# Patient Record
Sex: Male | Born: 1955 | Race: White | Hispanic: No | Marital: Married | Smoking: Never smoker
Health system: Southern US, Community
[De-identification: ages and names within clinical notes are randomized; demographics above are authoritative.]

## PROBLEM LIST (undated history)

## (undated) DIAGNOSIS — R002 Palpitations: Secondary | ICD-10-CM

## (undated) DIAGNOSIS — T148XXA Other injury of unspecified body region, initial encounter: Secondary | ICD-10-CM

## (undated) DIAGNOSIS — E785 Hyperlipidemia, unspecified: Secondary | ICD-10-CM

## (undated) DIAGNOSIS — M1712 Unilateral primary osteoarthritis, left knee: Secondary | ICD-10-CM

## (undated) DIAGNOSIS — M1990 Unspecified osteoarthritis, unspecified site: Secondary | ICD-10-CM

## (undated) DIAGNOSIS — R079 Chest pain, unspecified: Secondary | ICD-10-CM

## (undated) DIAGNOSIS — T7840XA Allergy, unspecified, initial encounter: Secondary | ICD-10-CM

## (undated) DIAGNOSIS — C44300 Unspecified malignant neoplasm of skin of unspecified part of face: Secondary | ICD-10-CM

## (undated) HISTORY — DX: Other injury of unspecified body region, initial encounter: T14.8XXA

## (undated) HISTORY — DX: Hyperlipidemia, unspecified: E78.5

## (undated) HISTORY — DX: Unspecified malignant neoplasm of skin of unspecified part of face: C44.300

## (undated) HISTORY — PX: OTHER SURGICAL HISTORY: SHX169

## (undated) HISTORY — DX: Unilateral primary osteoarthritis, left knee: M17.12

## (undated) HISTORY — PX: TONSILLECTOMY: SUR1361

## (undated) HISTORY — DX: Unspecified osteoarthritis, unspecified site: M19.90

## (undated) HISTORY — DX: Palpitations: R00.2

## (undated) HISTORY — PX: CHOLECYSTECTOMY: SHX55

## (undated) HISTORY — DX: Allergy, unspecified, initial encounter: T78.40XA

## (undated) HISTORY — DX: Chest pain, unspecified: R07.9

## (undated) HISTORY — PX: KNEE SURGERY: SHX244

---

## 2004-03-27 ENCOUNTER — Emergency Department (HOSPITAL_COMMUNITY): Admission: EM | Admit: 2004-03-27 | Discharge: 2004-03-27 | Payer: Self-pay | Admitting: Emergency Medicine

## 2007-05-09 ENCOUNTER — Ambulatory Visit: Payer: Self-pay | Admitting: Gastroenterology

## 2007-05-23 ENCOUNTER — Ambulatory Visit: Payer: Self-pay | Admitting: Gastroenterology

## 2010-05-04 ENCOUNTER — Ambulatory Visit: Payer: Self-pay | Admitting: Vascular Surgery

## 2010-08-03 ENCOUNTER — Ambulatory Visit: Payer: Self-pay | Admitting: Vascular Surgery

## 2010-09-14 ENCOUNTER — Ambulatory Visit: Payer: Self-pay | Admitting: Vascular Surgery

## 2010-09-21 ENCOUNTER — Ambulatory Visit: Payer: Self-pay | Admitting: Vascular Surgery

## 2010-09-26 ENCOUNTER — Ambulatory Visit (HOSPITAL_COMMUNITY)
Admission: RE | Admit: 2010-09-26 | Discharge: 2010-09-26 | Payer: Self-pay | Source: Home / Self Care | Attending: Vascular Surgery | Admitting: Vascular Surgery

## 2010-09-26 IMAGING — CR DG CHEST 2V
2 series · 2 of 2 positions shown · non-contrast
Comparison: None.

CLINICAL DATA: Preoperative chest radiograph for left leg varicose
vein ligation and stripping.

CHEST - 2 VIEW

[view not recorded (1 of 2)]
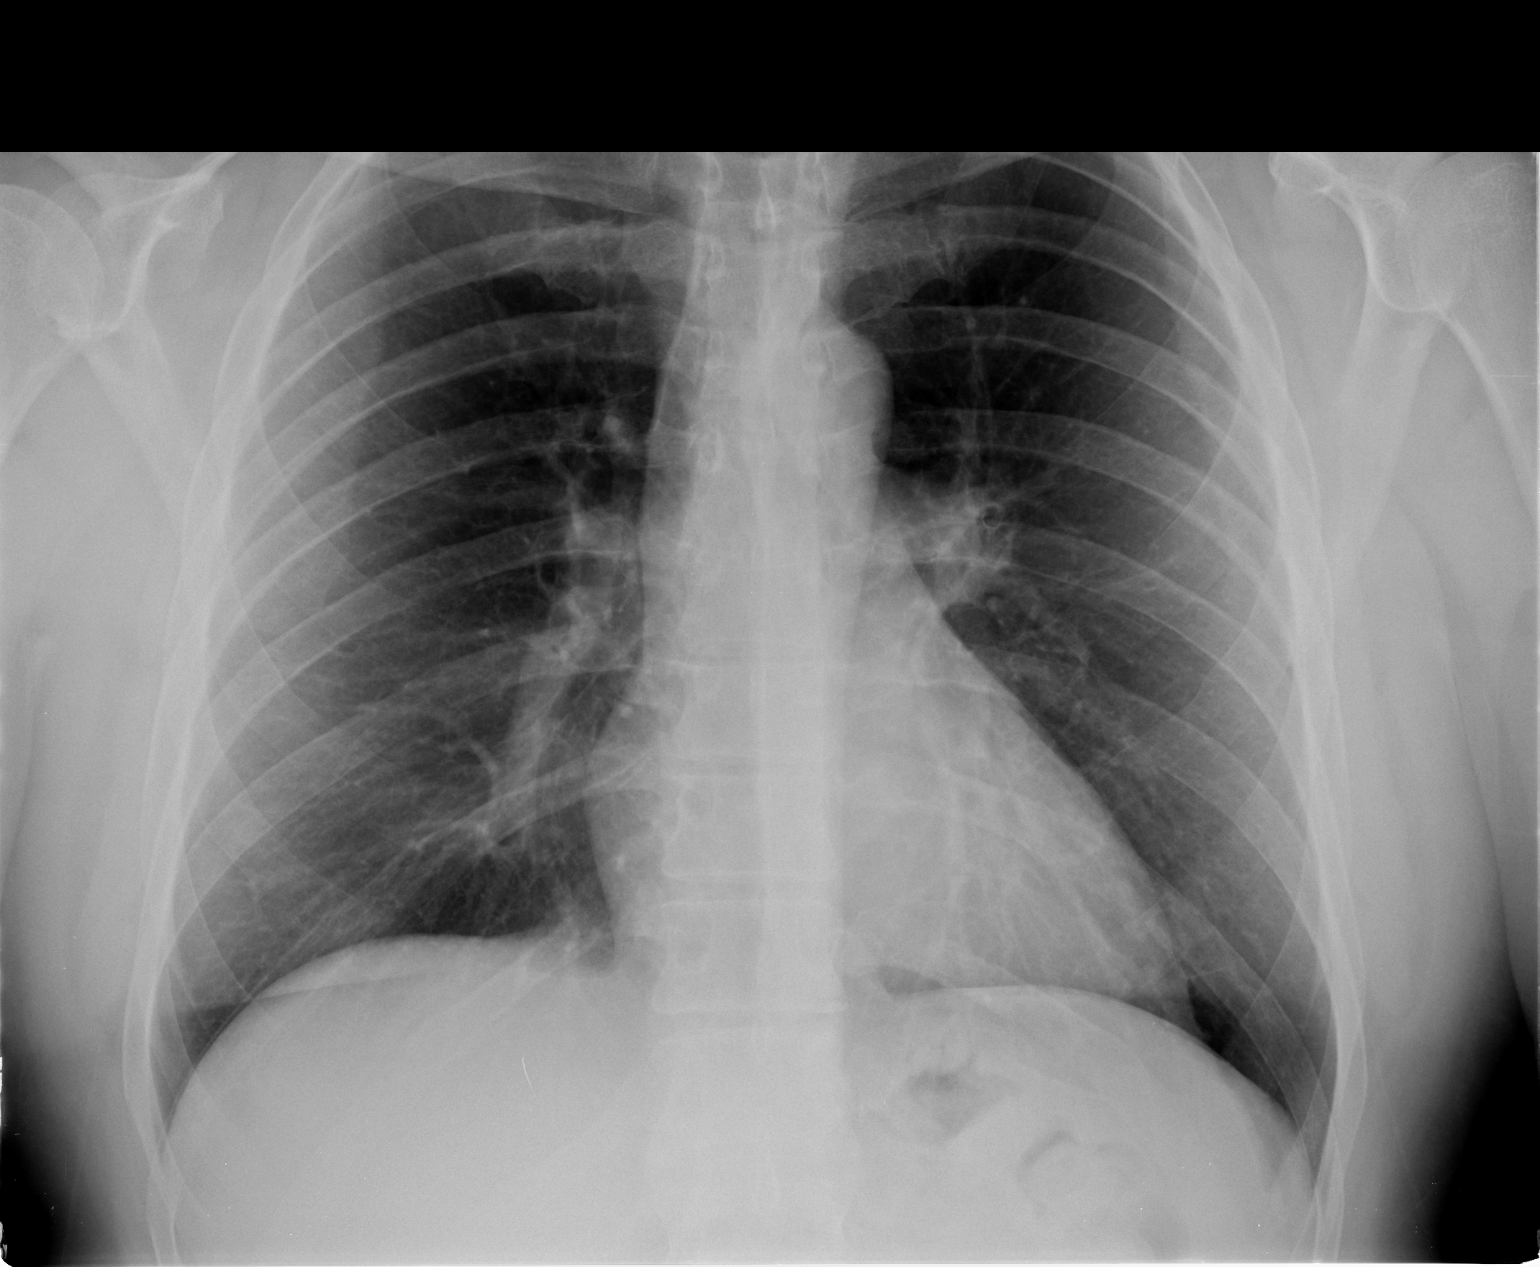

[view not recorded (2 of 2)]
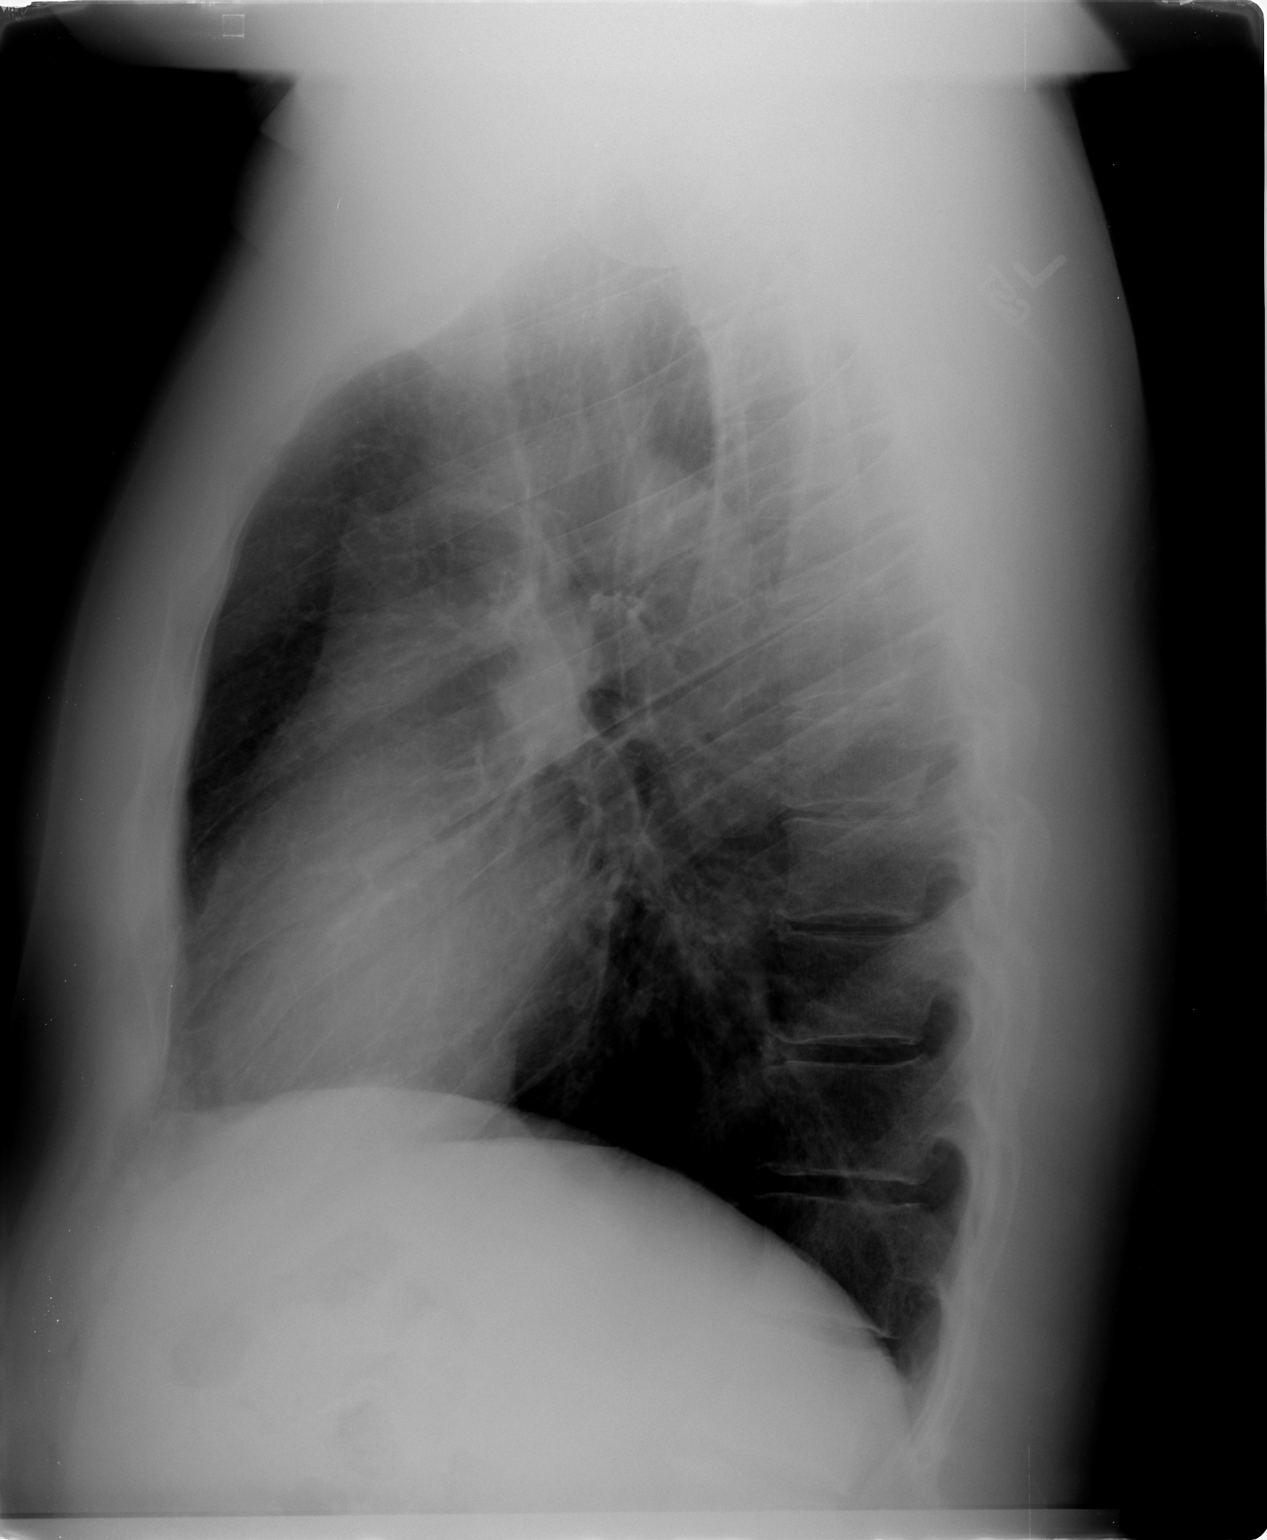

[2 of 2 positions shown; findings below may reference images not displayed]

FINDINGS: The lungs are well-aerated and clear.  There is no
evidence of focal opacification, pleural effusion or pneumothorax.

The heart is normal in size; the mediastinal contour is within
normal limits.  No acute osseous abnormalities are seen.
IMPRESSION: No acute cardiopulmonary process seen.

## 2010-10-11 ENCOUNTER — Ambulatory Visit
Admission: RE | Admit: 2010-10-11 | Discharge: 2010-10-11 | Payer: Self-pay | Source: Home / Self Care | Attending: Vascular Surgery | Admitting: Vascular Surgery

## 2010-12-19 LAB — POCT I-STAT 4, (NA,K, GLUC, HGB,HCT)
Potassium: 4.1 mEq/L (ref 3.5–5.1)
Sodium: 140 mEq/L (ref 135–145)

## 2010-12-19 LAB — SURGICAL PCR SCREEN
MRSA, PCR: NEGATIVE
Staphylococcus aureus: NEGATIVE

## 2011-02-21 NOTE — Procedures (Signed)
LOWER EXTREMITY VENOUS REFLUX EXAM   INDICATION:  Bilateral lower extremity varicose veins.   EXAM:  Using color-flow imaging and pulse Doppler spectral analysis, the  bilateral common femoral, superficial femoral, popliteal, posterior  tibial, greater and lesser saphenous veins were evaluated.  There is  evidence suggesting deep venous insufficiency in the bilateral common  femoral and left superficial femoral veins with reflux of >500  milliseconds.   The bilateral saphenofemoral junctions and GSVs are not competent with  reflux of >500 milliseconds.   The bilateral proximal short saphenous veins demonstrate competency.   GSV Diameter (used if found to be incompetent only)                                            Right    Left  Proximal Greater Saphenous Vein           0.94 cm  1.1 cm  Proximal-to-mid-thigh                     cm       cm  Mid thigh                                 0.97 cm  1.2 cm  Mid-distal thigh                          cm       cm  Distal thigh                              1.0 cm   2.8 cm  Knee                                      0.91 cm  1.0 cm   IMPRESSION:  1. Bilateral greater saphenous vein reflux is noted as described      above.  2. The right greater saphenous vein is tortuous focally at the mid      thigh level.  The left greater saphenous vein is not tortuous.  3. The deep venous system is not competent, as described above.  4. The bilateral short saphenous veins are competent.  5. There is an incompetent perforator noted at the left distal medial      ankle level measuring 0.53 cm.        ___________________________________________  Larina Earthly, M.D.   CH/MEDQ  D:  05/05/2010  T:  05/05/2010  Job:  [id number]

## 2011-02-21 NOTE — Assessment & Plan Note (Signed)
OFFICE VISIT   Philip Ross, Philip Ross  DOB:  06/04/1956                                       08/03/2010  [id number]#:[id number]   Patient presents today for continued discussion regarding his severe  bilateral venous hypertension.  He has had no improvement with  compression garment usage.  He has actually suffered a new episode of  superficial thrombophlebitis over the large varicosities on the  pretibial aspect of his right leg.  He reports that his legs are heavy  with prolonged standing, right worse than left.  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  He also reports that doing yard work or  exercise has made him diminish the frequency of this.  Also upon  returning home from work, he has to elevate his legs due to swelling and  the tiredness.   I re-imaged his veins, and this does show large saphenous veins  bilaterally with reflux into these varicosities.  He does have a  markedly enlarged left great saphenous vein at the above-knee position  with a diameter being 2.8 cm.  I explained that this precludes him from  laser ablation.   I have recommended right leg laser ablation and stab phlebectomy for  relief of his symptoms as an outpatient in our office and a staged later  date left leg ligation stripping of great saphenous vein and stab  phlebectomy in the hospital.  We will schedule this at his earliest  convenience.     Larina Earthly, M.D.  Electronically Signed   TFE/MEDQ  D:  08/03/2010  T:  08/03/2010  Job:  [id number]

## 2011-02-21 NOTE — Assessment & Plan Note (Signed)
OFFICE VISIT   Philip Ross, Philip Ross  DOB:  15-Sep-1956                                       10/11/2010  ZOXWR#:[id number]   Patient presents today for follow-up of his staged bilateral great  saphenous vein treatment.  He initially had a laser ablation of his  right great saphenous vein from just below his knee to the  saphenofemoral junction with stab phlebectomies.  He subsequently  underwent a left great saphenous vein ligation and stripping and stab  phlebectomies in the hospital as an outpatient under general anesthesia  on 09/26/10.  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  He has done quite well with the procedures.  He  has reported more discomfort in the medial thigh associated with the  left leg than the right leg.   He does have the usual amount of thickening in the mid portion of his  thigh.  All of his stab phlebectomy sites and incisions are healing  quite nicely.   He is released to full activity and will see [place] on an as-needed basis.     Larina Earthly, M.D.  Electronically Signed   TFE/MEDQ  D:  10/11/2010  T:  10/11/2010  Job:  [id number]   cc:   Francoise Schaumann. Halm, DO, FAAP

## 2011-02-21 NOTE — Assessment & Plan Note (Signed)
OFFICE VISIT   Philip Ross, Philip Ross  DOB:  Dec 09, 1955                                       09/20/2010  [id number]#:[id number]   This patient presents today one week follow-up from his laser ablation  of his right great saphenous vein and stab phlebectomies of multiple  tributary varicosities as an outpatient in my office on 09/14/2010.  He  did quite well throughout the procedure.  Duplex today shows no evidence  of DVT with successful ablation of his saphenous vein.  He does have a  severe venous pathology in the left leg and is scheduled for a ligation  and stripping of his left great saphenous vein and stab phlebectomy in  the hospital due to the aneurysmal dilatation making laser ablation not  possible in the left leg.  This is scheduled for 09/26/2010.     Larina Earthly, M.D.  Electronically Signed   TFE/MEDQ  D:  10/19/2010  T:  10/20/2010  Job:  [id number]

## 2011-02-21 NOTE — Procedures (Signed)
DUPLEX DEEP VENOUS EXAM - LOWER EXTREMITY   INDICATION:  Right GSV ablation.   HISTORY:  Edema:  Yes.  Trauma/Surgery:  See above.  Pain:  Yes.  PE:  No.  Previous DVT:  Anticoagulants:  Other:   DUPLEX EXAM:                CFV   SFV   PopV  PTV    GSV                R  L  R  L  R  L  R   L  R  L  Thrombosis    +     o     o     o      +  Spontaneous   +     +     +     +      o  Phasic        +     +     +     +      o  Augmentation  +     +     +     +      o  Compressible  +     +     +     +      o  Competent     +     +     +     +      o   Legend:  + - yes  o - no  p - partial  D - decreased   IMPRESSION:  1. Successful ablation of right greater saphenous vein with areas of      thrombosis at the phlebectomy sites.  2. Greater saphenous vein thrombus appears to mildly extend into the      common femoral vein.  Thrombus is nonocclusive and not mobile.     _____________________________  Larina Earthly, M.D.   LT/MEDQ  D:  09/21/2010  T:  09/21/2010  Job:  [id number]

## 2011-02-21 NOTE — Assessment & Plan Note (Signed)
OFFICE VISIT   Philip Ross, Philip Ross  DOB:  26-Aug-1956                                       09/14/2010  LKGMW#:[id number]   This patient presents today for laser ablation of his right great  saphenous vein from mid calf to just below the saphenofemoral junction  and also a stab phlebectomy of multiple tributary varicosities.  He had  no immediate complication.  He did have a large venous plexus area in  the mid thigh requiring a separate ablation from his below-knee position  to mid thigh and then from mid thigh up to his saphenofemoral junction.  We will instruct him on his  postoperative  instructions and he will see  [place] again in 1 week for a duplex follow-up and office visit.     Larina Earthly, M.D.  Electronically Signed   TFE/MEDQ  D:  09/14/2010  T:  09/15/2010  Job:  [id number]

## 2011-02-21 NOTE — Consult Note (Signed)
NEW PATIENT CONSULTATION   STALEY, LUNZ D  DOB:  [birth year]                                       05/04/2010  FAOZH#:[id number]   The patient presents today for evaluation of severe bilateral lower  extremity venous varicosities and venous hypertension.  He is an active  healthy 55 year old gentleman who has a long history of venous  varicosities.  He had tributary removal in [place] in 1989 on his  right leg, this was through large incisions to his right thigh and  proximal calf.  He had very early recurrent disease and has had  progressive changes since that time.  He does have a history of fracture  in his left leg with an open fracture through the skin in the medial  calf.  He has very large varicosities in the left calf as well.  He  reports that he is having increasingly severe discomfort with these with  prolonged standing and activity.  He also has had several episodes of  what sounds clinically to be a superficial thrombophlebitis on several  occasions in his right medial calf.  His past history is otherwise  completely normal with no major medical difficulties.   MEDICATIONS:  He is on no medications other than aspirin and  multivitamin.  His only surgery was varicose vein surgery in 1989.  Otherwise, no history of diabetes, cardiac disease or other problems.   SOCIAL HISTORY:  He is married with two children.  He works as a Audiological scientist.  He does not smoke and does not drink alcohol.   FAMILY HISTORY:  Positive for cardiac disease in his father and vascular  disease in his mother.   REVIEW OF SYSTEMS:  No weight loss or weight gain, he is 6 feet 1 inch  tall and weighs 242 pounds.  Otherwise remaining review of systems is  negative.   PHYSICAL EXAM:  Well-developed, well-nourished white male appearing  stated age of 55.  Blood pressure is 111/72, pulse 91, respirations 18.  He is in no acute distress.  HEENT:  Normal.  He  has 2+ radial and 2+ dorsalis pedis pulses  bilaterally.  MUSCULOSKELETAL:  Shows no major deformity or cyanosis.  NEUROLOGIC:  No focal weakness or paresthesias.  SKIN:  Without ulcers or rashes.  He has a very large plexus of  varicosities in the medial aspect of his right thigh and also large  varicosities throughout his calf on the right leg.  On the left leg he  has large varicosities in the medial distal left calf.  He underwent  noninvasive vascular laboratory studies in our office and I ordered and  independently reviewed these with the patient.  This does show gross  reflux throughout his great saphenous vein bilaterally.  He has mild  reflux in his common femoral vein bilaterally.  There is no evidence of  DVT.  I discussed options with the patient.  He has not consistently  worn compression garments.  We have fitted him today with 20-30 mmHg  thigh-high compression garments and instructed him on the use of these.  I explained that I feel that his initial procedure in 1989 did not  address the venous hypertension since the saphenous vein itself was not  addressed.  Unfortunately he had very large incisions for removal of the  tributary  varicosities which I also explained would certainly be not the  standard of care today and actually was not the standard of care in 1989  as well.  We will see him again in 3 months for further discussion.  I  do feel that he will be an excellent candidate for laser ablation of his  great saphenous vein and stab phlebectomy of multiple tributary  varicosities for symptom relief.  He would require access of his right  great saphenous vein at the below-knee position and also upper thigh due  to the saphenous vein running through this large plexus in his medial  right thigh.  We will see him again for further discussion in 3 months.     Larina Earthly, M.D.  Electronically Signed   TFE/MEDQ  D:  05/04/2010  T:  05/05/2010  Job:  [id number]

## 2012-03-20 ENCOUNTER — Encounter: Payer: Self-pay | Admitting: *Deleted

## 2012-03-20 DIAGNOSIS — T148XXA Other injury of unspecified body region, initial encounter: Secondary | ICD-10-CM | POA: Insufficient documentation

## 2012-03-20 DIAGNOSIS — R002 Palpitations: Secondary | ICD-10-CM | POA: Insufficient documentation

## 2012-08-27 ENCOUNTER — Encounter: Payer: Self-pay | Admitting: Cardiovascular Disease

## 2017-03-23 ENCOUNTER — Encounter: Payer: Self-pay | Admitting: Orthopedic Surgery

## 2017-03-23 ENCOUNTER — Ambulatory Visit (INDEPENDENT_AMBULATORY_CARE_PROVIDER_SITE_OTHER): Payer: BC Managed Care – PPO | Admitting: Orthopedic Surgery

## 2017-03-23 ENCOUNTER — Ambulatory Visit (INDEPENDENT_AMBULATORY_CARE_PROVIDER_SITE_OTHER): Payer: BC Managed Care – PPO

## 2017-03-23 VITALS — BP 125/78 | HR 64 | Ht 74.0 in | Wt 226.0 lb

## 2017-03-23 DIAGNOSIS — M25511 Pain in right shoulder: Secondary | ICD-10-CM

## 2017-03-23 DIAGNOSIS — M7521 Bicipital tendinitis, right shoulder: Secondary | ICD-10-CM

## 2017-03-23 NOTE — Patient Instructions (Signed)

## 2017-03-23 NOTE — Progress Notes (Signed)
  NEW PATIENT OFFICE VISIT    Chief Complaint  Patient presents with  . 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 He notices when he is holding something or holding something away from the body the pain increases no prior treatment.    Review of Systems  Respiratory: Negative for shortness of breath.   Cardiovascular: Negative for chest pain.     Past Medical History:  Diagnosis Date  . Arthritis   . Bone fracture   . Hyperlipidemia   . Palpitations     Past Surgical History:  Procedure Laterality Date  . KNEE SURGERY    . varicose vein removal      Family History  Problem Relation Age of Onset  . Heart attack Father    Social History  Substance Use Topics  . Smoking status: Never Smoker  . Smokeless tobacco: Never Used  . Alcohol use 0.6 - 1.2 oz/week    1 - 2 Cans of beer per week     Comment: per week    BP 125/78   Pulse 64   Ht 6\' 2"  (1.88 m)   Wt 226 lb (102.5 kg)   BMI 29.02 kg/m   Physical Exam  Constitutional: He appears well-developed and well-nourished.  Vital signs have been reviewed and are stable. Gen. appearance the patient is well-developed and well-nourished with normal grooming and hygiene. The patient is oriented 3 with normal mood and affect.  Vitals reviewed.   Right Shoulder Exam  Right shoulder exam is normal.  Tenderness  The patient is experiencing tenderness in the biceps tendon.  Range of Motion  The patient has normal right shoulder ROM.  Muscle Strength  The patient has normal right shoulder strength.  Tests  Apprehension: negative Cross Arm: negative Drop Arm: negative Hawkin's test: negative Impingement: negative Sulcus: absent  Other  Erythema: absent Sensation: normal Pulse: present  Comments:   Speed and Yergason tests were performed. Speed test was positive Yergason test was negative   Left Shoulder Exam  Left shoulder exam is normal.  Tenderness  The patient is experiencing no tenderness.     Range of Motion  The patient has normal left shoulder ROM.  Muscle Strength  The patient has normal left shoulder strength.  Tests  Apprehension: negative  Other  Erythema: absent Sensation: normal Pulse: present       Meds ordered this encounter  Medications  . Multiple Vitamins-Minerals (ICAPS PO)    Sig: Take by mouth.    Encounter Diagnoses  Name Primary?  . Right shoulder pain, unspecified chronicity Yes  . Biceps tendinitis of right upper extremity      PLAN:   X-rays show normal shoulder with type II acromion  Patient gave verbal consent and timeout was taken to confirm the right shoulder as to site of injection I injected the right biceps tendon 25-gauge needle and 40 mg of Depo-Medrol and 3 mL 1% lidocaine Ethyl chloride and alcohol use to prepare the skin No complications were noted

## 2017-04-06 ENCOUNTER — Ambulatory Visit (INDEPENDENT_AMBULATORY_CARE_PROVIDER_SITE_OTHER): Payer: BC Managed Care – PPO | Admitting: Orthopedic Surgery

## 2017-04-06 ENCOUNTER — Encounter: Payer: Self-pay | Admitting: Orthopedic Surgery

## 2017-04-06 DIAGNOSIS — M7521 Bicipital tendinitis, right shoulder: Secondary | ICD-10-CM

## 2017-04-06 NOTE — Patient Instructions (Signed)

## 2017-04-06 NOTE — Progress Notes (Signed)
Follow up   Pain right shoulder  Patient received biceps tendon injection. He got good pain relief he's just a little bit sore and is considering getting another shot  Exam shows tenderness over the right biceps tendon  He received injection  Biceps tendon injection Right biceps tendon was injected The patient gave verbal consent for cortisone injection Timeout confirmed the site of injection Medications used included 40 mg of Depo-Medrol and 3 mL 1% lidocaine After alcohol and ethyl chloride preparation the point of maximal tenderness was injected over the right biceps tendon there were no complications

## 2017-06-22 ENCOUNTER — Encounter: Payer: Self-pay | Admitting: Internal Medicine

## 2017-07-13 ENCOUNTER — Encounter: Payer: Self-pay | Admitting: Gastroenterology

## 2017-08-20 ENCOUNTER — Encounter: Payer: Self-pay | Admitting: Gastroenterology

## 2017-09-04 ENCOUNTER — Ambulatory Visit: Payer: BC Managed Care – PPO | Admitting: Urology

## 2017-09-04 DIAGNOSIS — N509 Disorder of male genital organs, unspecified: Secondary | ICD-10-CM

## 2017-09-28 ENCOUNTER — Ambulatory Visit (AMBULATORY_SURGERY_CENTER): Payer: Self-pay

## 2017-09-28 ENCOUNTER — Other Ambulatory Visit: Payer: Self-pay

## 2017-09-28 VITALS — Ht 73.0 in | Wt 241.2 lb

## 2017-09-28 DIAGNOSIS — Z1211 Encounter for screening for malignant neoplasm of colon: Secondary | ICD-10-CM

## 2017-09-28 MED ORDER — PEG-KCL-NACL-NASULF-NA ASC-C 140 G PO SOLR: 1.0000 | Freq: Once | ORAL | 0 refills | Status: AC

## 2017-09-28 NOTE — Progress Notes (Signed)
Denies allergies to eggs or soy products. Denies complication of anesthesia or sedation. Denies use of weight loss medication. Denies use of O2.   Emmi instructions declined.  

## 2017-10-11 ENCOUNTER — Encounter: Payer: Self-pay | Admitting: Gastroenterology

## 2017-10-12 ENCOUNTER — Telehealth: Payer: Self-pay | Admitting: Gastroenterology

## 2017-10-12 NOTE — Telephone Encounter (Signed)
Called pharmacy and spoke to the pharmacist.  He states that the prep was not covered by insurance.  He quoted a price of 150.00.  I called patient and left message with price of Plenvu.  Asked for him to call back at [phone].

## 2017-10-12 NOTE — Telephone Encounter (Signed)
Pharmacy states plenvu prep is not covered under pt ins and pt wanting something else called in. Pt is currently at pharmacy. pv on 12.21.18 and colon on 1.9.19

## 2017-10-12 NOTE — Telephone Encounter (Signed)
Patient called back and stated that he could pay the $150 for the Plenvu.  B.Ladislaus Repsher, CMA PV

## 2017-10-17 ENCOUNTER — Encounter: Payer: Self-pay | Admitting: Gastroenterology

## 2017-10-17 ENCOUNTER — Other Ambulatory Visit: Payer: Self-pay

## 2017-10-17 ENCOUNTER — Ambulatory Visit (AMBULATORY_SURGERY_CENTER): Payer: BC Managed Care – PPO | Admitting: Gastroenterology

## 2017-10-17 VITALS — BP 112/70 | HR 58 | Temp 97.8°F | Resp 17 | Ht 74.0 in | Wt 226.0 lb

## 2017-10-17 DIAGNOSIS — Z1211 Encounter for screening for malignant neoplasm of colon: Secondary | ICD-10-CM | POA: Diagnosis present

## 2017-10-17 DIAGNOSIS — D120 Benign neoplasm of cecum: Secondary | ICD-10-CM

## 2017-10-17 MED ORDER — SODIUM CHLORIDE 0.9 % IV SOLN: 500.0000 mL | Freq: Once | INTRAVENOUS | Status: DC

## 2017-10-17 NOTE — Patient Instructions (Signed)
**  Handouts given to patient on polyps and hemorrhoids**  YOU HAD AN ENDOSCOPIC PROCEDURE TODAY AT [facility]:   Refer to the procedure report that was given to you for any specific questions about what was found during the examination.  If the procedure report does not answer your questions, please call your gastroenterologist to clarify.  If you requested that your care partner not be given the details of your procedure findings, then the procedure report has been included in a sealed envelope for you to review at your convenience later.  YOU SHOULD EXPECT: Some feelings of bloating in the abdomen. Passage of more gas than usual.  Walking can help get rid of the air that was put into your GI tract during the procedure and reduce the bloating. If you had a lower endoscopy (such as a colonoscopy or flexible sigmoidoscopy) you may notice spotting of blood in your stool or on the toilet paper. If you underwent a bowel prep for your procedure, you may not have a normal bowel movement for a few days.  Please Note:  You might notice some irritation and congestion in your nose or some drainage.  This is from the oxygen used during your procedure.  There is no need for concern and it should clear up in a day or so.  SYMPTOMS TO REPORT IMMEDIATELY:   Following lower endoscopy (colonoscopy or flexible sigmoidoscopy):  Excessive amounts of blood in the stool  Significant tenderness or worsening of abdominal pains  Swelling of the abdomen that is new, acute  Fever of 100F or higher  Black, tarry-looking stools  For urgent or emergent issues, a gastroenterologist can be reached at any hour by calling [phone].   DIET:  We do recommend a small meal at first, but then you may proceed to your regular diet.  Drink plenty of fluids but you should avoid alcoholic beverages for 24 hours.  ACTIVITY:  You should plan to take it easy for the rest of today and you should NOT DRIVE or use  heavy machinery until tomorrow (because of the sedation medicines used during the test).    FOLLOW UP: Our staff will call the number listed on your records the next business day following your procedure to check on you and address any questions or concerns that you may have regarding the information given to you following your procedure. If we do not reach you, we will leave a message.  However, if you are feeling well and you are not experiencing any problems, there is no need to return our call.  We will assume that you have returned to your regular daily activities without incident.  If any biopsies were taken you will be contacted by phone or by letter within the next 1-3 weeks.  Please call us at [phone] if you have not heard about the biopsies in 3 weeks.    SIGNATURES/CONFIDENTIALITY: You and/or your care partner have signed paperwork which will be entered into your electronic medical record.  These signatures attest to the fact that that the information above on your After Visit Summary has been reviewed and is understood.  Full responsibility of the confidentiality of this discharge information lies with you and/or your care-partner.

## 2017-10-17 NOTE — Progress Notes (Signed)
Pt's states no medical or surgical changes since previsit or office visit. 

## 2017-10-17 NOTE — Progress Notes (Signed)
A/ox3 pleased with MAC, report to [employer]

## 2017-10-17 NOTE — Progress Notes (Signed)
Called to room to assist during endoscopic procedure.  Patient ID and intended procedure confirmed with present staff. Received instructions for my participation in the procedure from the performing physician.  

## 2017-10-17 NOTE — Op Note (Addendum)
[facility] Patient Name: Philip Ross Procedure Date: 10/17/2017 9:44 AM MRN: [id number] Endoscopist: Remo Lipps P. Armbruster MD, MD Age: 62 Referring MD:  Date of Birth: 1956/07/16 Gender: Male Account #: [id number] Procedure:                Colonoscopy Indications:              Screening for colorectal malignant neoplasm Medicines:                Monitored Anesthesia Care Procedure:                Pre-Anesthesia Assessment:                           - Prior to the procedure, a History and Physical                            was performed, and patient medications and                            allergies were reviewed. The patient's tolerance of                            previous anesthesia was also reviewed. The risks                            and benefits of the procedure and the sedation                            options and risks were discussed with the patient.                            All questions were answered, and informed consent                            was obtained. Prior Anticoagulants: The patient has                            taken no previous anticoagulant or antiplatelet                            agents. ASA Grade Assessment: II - A patient with                            mild systemic disease. After reviewing the risks                            and benefits, the patient was deemed in                            satisfactory condition to undergo the procedure.                           After obtaining informed consent, the colonoscope  was passed under direct vision. Throughout the                            procedure, the patient's blood pressure, pulse, and                            oxygen saturations were monitored continuously. The                            Model CF-HQ190L [id number]) scope was introduced                            through the anus and advanced to the the cecum,                            identified by  appendiceal orifice and ileocecal                            valve. The colonoscopy was performed without                            difficulty. The patient tolerated the procedure                            well. The quality of the bowel preparation was                            good. The ileocecal valve, appendiceal orifice, and                            rectum were photographed. Scope In: 9:50:01 AM Scope Out: 10:17:14 AM Scope Withdrawal Time: 0 hours 21 minutes 19 seconds  Total Procedure Duration: 0 hours 27 minutes 13 seconds  Findings:                 The perianal and digital rectal examinations were                            normal.                           Two sessile polyps were found in the cecum. The                            polyps were 4 to 7 mm in size. These polyps were                            removed with a cold snare. Resection and retrieval                            were complete.                           Internal hemorrhoids were found during  retroflexion. The hemorrhoids were small.                           The colon was tortous. The exam was otherwise                            without abnormality. Complications:            No immediate complications. Estimated blood loss:                            Minimal. Estimated Blood Loss:     Estimated blood loss was minimal. Impression:               - Two 4 to 7 mm polyps in the cecum, removed with a                            cold snare. Resected and retrieved.                           - Internal hemorrhoids.                           - Tortous colon.                           - The examination was otherwise normal. Recommendation:           - Patient has a contact number available for                            emergencies. The signs and symptoms of potential                            delayed complications were discussed with the                            patient. Return to  normal activities tomorrow.                            Written discharge instructions were provided to the                            patient.                           - Resume previous diet.                           - Continue present medications.                           - Await pathology results.                           - Repeat colonoscopy is recommended for  surveillance. The colonoscopy date will be                            determined after pathology results from today's                            exam become available for review.                           - No ibuprofen, naproxen, or other non-steroidal                            anti-inflammatory drugs for 2 weeks after polyp                            removal. Remo Lipps P. Armbruster MD, MD 10/17/2017 10:20:52 AM This report has been signed electronically.

## 2017-10-18 ENCOUNTER — Telehealth: Payer: Self-pay

## 2017-10-18 NOTE — Telephone Encounter (Signed)
  Follow up Call-  Call back number 10/17/2017  Post procedure Call Back phone  # [phone]  Permission to leave phone message Yes  Some recent data might be hidden     Patient questions:  Do you have a fever, pain , or abdominal swelling? No. Pain Score  0 *  Have you tolerated food without any problems? Yes.    Have you been able to return to your normal activities? Yes.    Do you have any questions about your discharge instructions: Diet   No. Medications  No. Follow up visit  No.  Do you have questions or concerns about your Care? No.  Actions: * If pain score is 4 or above: No action needed, pain <4.

## 2017-10-23 ENCOUNTER — Encounter: Payer: Self-pay | Admitting: Gastroenterology

## 2018-02-05 ENCOUNTER — Ambulatory Visit (INDEPENDENT_AMBULATORY_CARE_PROVIDER_SITE_OTHER): Payer: BC Managed Care – PPO

## 2018-02-05 ENCOUNTER — Encounter: Payer: Self-pay | Admitting: Orthopedic Surgery

## 2018-02-05 ENCOUNTER — Ambulatory Visit: Payer: BC Managed Care – PPO | Admitting: Orthopedic Surgery

## 2018-02-05 VITALS — BP 122/80 | HR 65 | Ht 74.0 in | Wt 240.0 lb

## 2018-02-05 DIAGNOSIS — M25562 Pain in left knee: Secondary | ICD-10-CM

## 2018-02-05 DIAGNOSIS — M79605 Pain in left leg: Secondary | ICD-10-CM

## 2018-02-05 DIAGNOSIS — M5410 Radiculopathy, site unspecified: Secondary | ICD-10-CM

## 2018-02-05 DIAGNOSIS — M1712 Unilateral primary osteoarthritis, left knee: Secondary | ICD-10-CM

## 2018-02-05 DIAGNOSIS — G8929 Other chronic pain: Secondary | ICD-10-CM | POA: Diagnosis not present

## 2018-02-05 MED ORDER — PREDNISONE 10 MG (48) PO TBPK: ORAL_TABLET | Freq: Every day | ORAL | 0 refills | Status: DC

## 2018-02-05 NOTE — Addendum Note (Signed)
Addended by: Carole Civil on: 02/05/2018 09:54 AM   Modules accepted: Orders

## 2018-02-05 NOTE — Progress Notes (Addendum)
NEW PATIENT OFFICE VISIT   Chief Complaint  Patient presents with  . Leg Pain    left lower leg pain goes from hip to front of lower leg / worse with standing  . Knee Pain    left     MEDICAL DECISION SECTION  xrays ordered? YES 2 BODY PARTS   My independent reading of xrays: See the dictated report  The left knee shows mild varus alignment with mild osteoarthritis of the medial compartment without secondary bone changes  The lumbar spine x-ray shows mild reactive scoliosis mild facet arthritis consistent with lumbar spondylosis mild   Encounter Diagnoses  Name Primary?  . Radicular leg pain Yes  . Primary osteoarthritis of left knee      PLAN:  I am going to recommend a steroid Dosepak  3-week follow-up  Meds ordered this encounter  Medications  . predniSONE (STERAPRED UNI-PAK 48 TAB) 10 MG (48) TBPK tablet    Sig: Take by mouth daily. 10 mg 12-day double strength Dosepak as directed    Dispense:  48 tablet    Refill:  0   Injection? NO MRI/CT/? NO   Chief Complaint  Patient presents with  . Leg Pain    left lower leg pain goes from hip to front of lower leg / worse with standing  . Knee Pain    left    62 year old male complains of pain left leg and left knee.  He had an open fracture tib-fib junior in high school treated successfully with irrigation debridement and multiple serial castings.  Over the years he has had aching in the lower leg when the weather changes or gets cold.  However, he has had 6 weeks of dull diffuse left leg pain involving his left hip knee and lower leg not associated with any weakness or numbness or tingling.  He does note pain with standing for long periods of time.  He does not localize the knee pain it is diffuse.  He notes no swelling or catching or locking or giving way in the knee denies back pain   Review of Systems  Constitutional: Negative for chills, fever and weight loss.  Respiratory: Negative for shortness of  breath.   Cardiovascular: Negative for chest pain.  Neurological: Negative for tingling.     Past Medical History:  Diagnosis Date  . Allergy   . Arthritis   . Bone fracture   . Hyperlipidemia   . Palpitations     Past Surgical History:  Procedure Laterality Date  . KNEE SURGERY    . TONSILLECTOMY    . varicose vein removal      Family History  Problem Relation Age of Onset  . Heart attack Father   . Colon cancer Neg Hx   . Esophageal cancer Neg Hx   . Pancreatic cancer Neg Hx   . Rectal cancer Neg Hx   . Stomach cancer Neg Hx    Social History   Tobacco Use  . Smoking status: Never Smoker  . Smokeless tobacco: Never Used  Substance Use Topics  . Alcohol use: Yes    Alcohol/week: 0.6 - 1.2 oz    Types: 1 - 2 Cans of beer per week    Comment: per week  . Drug use: Not on file    @ALL @  Current Meds  Medication Sig  . Multiple Vitamins-Minerals (ICAPS PO) Take by mouth.    BP 122/80   Pulse 65   Ht 6\' 2"  (1.88 m)  Wt 240 lb (108.9 kg)   BMI 30.81 kg/m   Physical Exam  Constitutional: He is oriented to person, place, and time. He appears well-developed and well-nourished.  Vital signs have been reviewed and are stable. Gen. appearance the patient is well-developed and well-nourished with normal grooming and hygiene.   Musculoskeletal:       Right knee: He exhibits no effusion.       Left knee: He exhibits no effusion.  Neurological: He is alert and oriented to person, place, and time.  Skin: Skin is warm and dry. No erythema.  Psychiatric: He has a normal mood and affect.  Vitals reviewed.   Right Knee Exam   Muscle Strength  The patient has normal right knee strength.  Tenderness  The patient is experiencing no tenderness.   Range of Motion  The patient has normal right knee ROM.  Tests  Lachman:  Anterior - negative     Drawer:  Anterior - negative    Posterior - negative  Other  Scars: present Effusion: no effusion  present   Left Knee Exam   Muscle Strength  The patient has normal left knee strength.  Tenderness  The patient is experiencing no tenderness.   Range of Motion  The patient has normal left knee ROM.  Tests  McMurray:  Medial - negative  Varus: negative  Lachman:  Anterior - negative     Drawer:  Anterior - negative     Posterior - negative  Other  Erythema: absent Scars: absent Sensation: normal Pulse: present Swelling: none Effusion: no effusion present   Back Exam   Tenderness  The patient is experiencing no tenderness.   Range of Motion  The patient has normal back ROM.  Other  Gait: normal   Comments:  Mild pelvic obliquity no tenderness negative straight leg raises

## 2018-02-05 NOTE — Patient Instructions (Signed)

## 2018-02-25 ENCOUNTER — Encounter: Payer: Self-pay | Admitting: Orthopedic Surgery

## 2018-02-25 ENCOUNTER — Ambulatory Visit: Payer: BC Managed Care – PPO | Admitting: Orthopedic Surgery

## 2018-02-25 VITALS — BP 107/75 | HR 72 | Ht 74.0 in | Wt 240.0 lb

## 2018-02-25 DIAGNOSIS — M1712 Unilateral primary osteoarthritis, left knee: Secondary | ICD-10-CM

## 2018-02-25 DIAGNOSIS — M7521 Bicipital tendinitis, right shoulder: Secondary | ICD-10-CM | POA: Diagnosis not present

## 2018-02-25 DIAGNOSIS — M5410 Radiculopathy, site unspecified: Secondary | ICD-10-CM

## 2018-02-25 NOTE — Progress Notes (Signed)
Progress Note   Patient ID: Philip Ross, male   DOB: [birth year], 62 y.o.   MRN: [id number]  Chief Complaint  Patient presents with  . Back Pain    better after the prednisone taper   . Knee Pain    left     Medical decision-making Encounter Diagnoses  Name Primary?  . Radicular leg pain Yes  . Primary osteoarthritis of left knee   . Biceps tendinitis of right upper extremity    Established problem radicular leg pain improved  Primary osteoarthritis left knee worse  PLAN:   The radicular leg pain has responded well to the prednisone Dosepak  The patient is still complaining of painful left knee especially with weightbearing and standing for long periods of time   I recommended left knee injection he confirmed  Procedure note left knee injection verbal consent was obtained to inject left knee joint  Timeout was completed to confirm the site of injection  The medications used were 40 mg of Depo-Medrol and 1% lidocaine 3 cc  Anesthesia was provided by ethyl chloride and the skin was prepped with alcohol.  After cleaning the skin with alcohol a 20-gauge needle was used to inject the left knee joint. There were no complications. A sterile bandage was applied.       No orders of the defined types were placed in this encounter.        Chief Complaint  Patient presents with  . Back Pain    better after the prednisone taper   . Knee Pain    left    62 year old male with radicular left leg pain and left knee pain we treated him with steroid Dosepak.  He comes in today complaining of persistent and worsening left knee pain although the radicular leg pain has resolved.  He complains of a dull aching intermittent pain in the left knee which is brought on by standing in one place.  No associated catching locking swelling some loss of motion    Review of Systems  Constitutional: Negative for fever.  Skin: Negative.   Neurological: Negative for tingling and  focal weakness.   Past Medical History:  Diagnosis Date  . Allergy   . Arthritis   . Bone fracture   . Hyperlipidemia   . Palpitations     Current Meds  Medication Sig  . Multiple Vitamins-Minerals (ICAPS PO) Take by mouth.    Allergies  Allergen Reactions  . Niaspan [Niacin Er]     flushing     BP 107/75   Pulse 72   Ht 6\' 2"  (1.88 m)   Wt 240 lb (108.9 kg)   BMI 30.81 kg/m   Physical Exam  Constitutional: He is oriented to person, place, and time. He appears well-developed and well-nourished.  Vital signs have been reviewed and are stable. Gen. appearance the patient is well-developed and well-nourished with normal grooming and hygiene.   Musculoskeletal:       Legs: Neurological: He is alert and oriented to person, place, and time.  Skin: Skin is warm and dry. No erythema.  Psychiatric: He has a normal mood and affect.  Vitals reviewed.      Arther Abbott, MD 02/25/2018 12:04 PM

## 2018-08-06 ENCOUNTER — Other Ambulatory Visit (HOSPITAL_COMMUNITY): Payer: Self-pay | Admitting: Internal Medicine

## 2018-08-06 DIAGNOSIS — R1011 Right upper quadrant pain: Secondary | ICD-10-CM

## 2018-08-08 ENCOUNTER — Ambulatory Visit (HOSPITAL_COMMUNITY)
Admission: RE | Admit: 2018-08-08 | Discharge: 2018-08-08 | Disposition: A | Discharge status: Home / Self Care | Payer: BC Managed Care – PPO | Source: Ambulatory Visit | Attending: Internal Medicine | Admitting: Internal Medicine

## 2018-08-08 DIAGNOSIS — R1011 Right upper quadrant pain: Secondary | ICD-10-CM

## 2018-08-08 DIAGNOSIS — K760 Fatty (change of) liver, not elsewhere classified: Secondary | ICD-10-CM | POA: Diagnosis not present

## 2018-08-08 DIAGNOSIS — K8020 Calculus of gallbladder without cholecystitis without obstruction: Secondary | ICD-10-CM | POA: Diagnosis not present

## 2018-08-13 ENCOUNTER — Ambulatory Visit (HOSPITAL_COMMUNITY): Payer: BC Managed Care – PPO

## 2018-08-19 ENCOUNTER — Other Ambulatory Visit: Payer: Self-pay | Admitting: Surgery

## 2018-11-01 ENCOUNTER — Other Ambulatory Visit: Payer: Self-pay | Admitting: Surgery

## 2019-12-07 ENCOUNTER — Ambulatory Visit: Payer: BC Managed Care – PPO | Attending: Internal Medicine

## 2019-12-07 DIAGNOSIS — Z23 Encounter for immunization: Secondary | ICD-10-CM | POA: Insufficient documentation

## 2019-12-07 NOTE — Progress Notes (Signed)
   Covid-19 Vaccination Clinic  Name:  GENEVA LEAVELLE    MRN: [id number] DOB: Oct 12, 1955  12/07/2019  Mr. Whitsell was observed post Covid-19 immunization for 15 minutes without incidence. He was provided with Vaccine Information Sheet and instruction to access the V-Safe system.   Mr. Mikeal was instructed to call 911 with any severe reactions post vaccine: Marland Kitchen Difficulty breathing  . Swelling of your face and throat  . A fast heartbeat  . A bad rash all over your body  . Dizziness and weakness    Immunizations Administered    Name Date Dose VIS Date Route   Moderna COVID-19 Vaccine 12/07/2019 10:03 AM 0.5 mL 09/09/2019 Intramuscular   Manufacturer: Moderna   Lot: [id number]   [id number]

## 2020-01-10 ENCOUNTER — Ambulatory Visit: Payer: BC Managed Care – PPO | Attending: Internal Medicine

## 2020-01-10 DIAGNOSIS — Z23 Encounter for immunization: Secondary | ICD-10-CM

## 2020-01-10 NOTE — Progress Notes (Signed)
   Covid-19 Vaccination Clinic  Name:  Philip Ross    MRN: [id number] DOB: Jun 03, 1956  01/10/2020  Philip Ross was observed post Covid-19 immunization for 15 minutes without incident. He was provided with Vaccine Information Sheet and instruction to access the V-Safe system.   Philip Ross was instructed to call 911 with any severe reactions post vaccine: Marland Kitchen Difficulty breathing  . Swelling of face and throat  . A fast heartbeat  . A bad rash all over body  . Dizziness and weakness   Immunizations Administered    Name Date Dose VIS Date Route   Moderna COVID-19 Vaccine 01/10/2020  9:56 AM 0.5 mL 09/09/2019 Intramuscular   Manufacturer: Moderna   Lot: [id number]   [place]:[id number]

## 2020-10-06 ENCOUNTER — Encounter (HOSPITAL_COMMUNITY): Payer: Self-pay | Admitting: *Deleted

## 2020-10-06 ENCOUNTER — Other Ambulatory Visit: Payer: Self-pay

## 2020-10-06 ENCOUNTER — Emergency Department (HOSPITAL_COMMUNITY): Payer: BC Managed Care – PPO

## 2020-10-06 ENCOUNTER — Emergency Department (HOSPITAL_COMMUNITY)
Admission: EM | Admit: 2020-10-06 | Discharge: 2020-10-06 | Disposition: A | Discharge status: Home / Self Care | Payer: BC Managed Care – PPO | Attending: Emergency Medicine | Admitting: Emergency Medicine

## 2020-10-06 DIAGNOSIS — R0789 Other chest pain: Secondary | ICD-10-CM | POA: Diagnosis present

## 2020-10-06 LAB — BASIC METABOLIC PANEL
Anion gap: 9 (ref 5–15)
BUN: 20 mg/dL (ref 8–23)
CO2: 25 mmol/L (ref 22–32)
Calcium: 9 mg/dL (ref 8.9–10.3)
Chloride: 104 mmol/L (ref 98–111)
Creatinine, Ser: 1.05 mg/dL (ref 0.61–1.24)
GFR, Estimated: >60 mL/min (ref >60)
Glucose, Bld: 100 mg/dL — ABNORMAL HIGH (ref 70–99)
Potassium: 4.2 mmol/L (ref 3.5–5.1)
Sodium: 138 mmol/L (ref 135–145)

## 2020-10-06 LAB — TROPONIN I (HIGH SENSITIVITY)
Troponin I (High Sensitivity): 3 ng/L (ref <18)
Troponin I (High Sensitivity): 4 ng/L (ref <18)

## 2020-10-06 LAB — CBC
HCT: 42.2 % (ref 39.0–52.0)
Hemoglobin: 14.8 g/dL (ref 13.0–17.0)
MCH: 31.9 pg (ref 26.0–34.0)
MCHC: 35.1 g/dL (ref 30.0–36.0)
MCV: 90.9 fL (ref 80.0–100.0)
Platelets: 204 10*3/uL (ref 150–400)
RBC: 4.64 MIL/uL (ref 4.22–5.81)
RDW: 12.5 % (ref 11.5–15.5)
WBC: 5.6 10*3/uL (ref 4.0–10.5)
nRBC: 0 % (ref 0.0–0.2)

## 2020-10-06 NOTE — ED Provider Notes (Signed)
[facility] EMERGENCY DEPARTMENT Provider Note  CSN: [id number] Arrival date & time: 10/06/20 1036    History Chief Complaint  Patient presents with  . Chest Pain    HPI  Philip Ross is a 64 y.o. male with history of palpitations but no known CAD reports intermittent chest pains for about 2 weeks, thought it was indigestion. Described as aching and midsternal, occasionally moves into his L shoulder. Not associated with SOB, diaphoresis or nausea. No particular provoking or relieving factors. He was seen at his PCP office today for a regularly scheduled visit and had an EKG there that was reportedly abnormal so he was sent to the ED. He is pain free today.    Past Medical History:  Diagnosis Date  . Allergy   . Arthritis   . Bone fracture   . Hyperlipidemia   . Palpitations     Past Surgical History:  Procedure Laterality Date  . KNEE SURGERY    . TONSILLECTOMY    . varicose vein removal      Family History  Problem Relation Age of Onset  . Heart attack Father   . Colon cancer Neg Hx   . Esophageal cancer Neg Hx   . Pancreatic cancer Neg Hx   . Rectal cancer Neg Hx   . Stomach cancer Neg Hx     Social History   Tobacco Use  . Smoking status: Never Smoker  . Smokeless tobacco: Never Used  Substance Use Topics  . Alcohol use: Yes    Alcohol/week: 1.0 - 2.0 standard drink    Types: 1 - 2 Cans of beer per week    Comment: per week     Home Medications Prior to Admission medications   Medication Sig Start Date End Date Taking? Authorizing Provider  aspirin EC 81 MG tablet Take 81 mg by mouth daily. Swallow whole.   Yes [provider]  Multiple Vitamins-Minerals (ICAPS PO) Take 1 tablet by mouth daily.   Yes [provider]     Allergies    Niaspan [niacin er]   Review of Systems   Review of Systems A comprehensive review of systems was completed and negative except as noted in HPI.    Physical Exam BP 133/82   Pulse (!) 58    Temp 97.9 F (36.6 C) (Oral)   Resp 14   Ht 6\' 1"  (1.854 m)   Wt 108.9 kg   SpO2 100%   BMI 31.66 kg/m   Physical Exam Vitals and nursing note reviewed.  Constitutional:      Appearance: Normal appearance.  HENT:     Head: Normocephalic and atraumatic.     Nose: Nose normal.     Mouth/Throat:     Mouth: Mucous membranes are moist.  Eyes:     Extraocular Movements: Extraocular movements intact.     Conjunctiva/sclera: Conjunctivae normal.  Cardiovascular:     Rate and Rhythm: Normal rate.  Pulmonary:     Effort: Pulmonary effort is normal.     Breath sounds: Normal breath sounds.  Abdominal:     General: Abdomen is flat.     Palpations: Abdomen is soft.     Tenderness: There is no abdominal tenderness.  Musculoskeletal:        General: No swelling. Normal range of motion.     Cervical back: Neck supple.  Skin:    General: Skin is warm and dry.  Neurological:     General: No focal deficit present.  Mental Status: He is alert.  Psychiatric:        Mood and Affect: Mood normal.      ED Results / Procedures / Treatments   Labs (all labs ordered are listed, but only abnormal results are displayed) Labs Reviewed  BASIC METABOLIC PANEL - Abnormal; Notable for the following components:      Result Value   Glucose, Bld 100 (*)    All other components within normal limits  CBC  TROPONIN I (HIGH SENSITIVITY)  TROPONIN I (HIGH SENSITIVITY)    EKG EKG Interpretation  Date/Time:  Wednesday October 06 2020 10:44:30 EST Ventricular Rate:  63 PR Interval:  184 QRS Duration: 94 QT Interval:  408 QTC Calculation: 417 R Axis:   -70 Text Interpretation: Normal sinus rhythm Left axis deviation Abnormal ECG No significant change since last tracing Confirmed by Susy Frizzle [phone]) on 10/06/2020 10:53:47 AM   Radiology DG Chest Portable 1 View  Result Date: 10/06/2020 CLINICAL DATA:  Chest pain EXAM: PORTABLE CHEST 1 VIEW COMPARISON:  Chest radiograph dated  09/26/2010 FINDINGS: The heart size and mediastinal contours are within normal limits. Both lungs are clear. The visualized skeletal structures are unremarkable. IMPRESSION: No active disease. Electronically Signed   By: Romona Curls M.D.   On: 10/06/2020 11:34    Procedures Procedures  Medications Ordered in the ED Medications - No data to display   MDM Rules/Calculators/A&P MDM PCP EKG is not available for me to review, but our EKG today is neg for ischemia, LAD is unchanged from previous. Symptoms are atypical for ACS. Initial labs and CXR are normal, will check second Trop. He is low risk without history of DM, HLD, HTN or tobacco abuse. If Trop remains neg, anticipate discharge with continued outpatient management and consideration of outpatient stress test. ED Course  I have reviewed the triage vital signs and the nursing notes.  Pertinent labs & imaging results that were available during my care of the patient were reviewed by me and considered in my medical decision making (see chart for details).  Clinical Course as of 10/07/20 0700  Wed Oct 06, 2020  1425 Second trop is still normal. Patient remains asymptomatic. Will refer back to PCP and Cardiology for further evaluation. RTED for any other concerns.  [CS]    Clinical Course User Index [CS] Pollyann Savoy, MD    Final Clinical Impression(s) / ED Diagnoses Final diagnoses:  Atypical chest pain    Rx / DC Orders ED Discharge Orders    None       Pollyann Savoy, MD 10/07/20 0700

## 2020-10-06 NOTE — ED Triage Notes (Signed)
Pt with chest pain mid that radiates to left shoulder for past couple weeks.  Pt seen at Dr. Scharlene Gloss office and EKG done and abnormal per pt.

## 2020-10-06 NOTE — ED Notes (Signed)
Pt denies chest pain at this time.

## 2020-11-04 NOTE — Progress Notes (Signed)
Cardiology Office Note:    Date:  11/05/2020   ID:  Philip Ross, DOB 04/30/1956, MRN [id number]  PCP:  Celene Squibb, MD  [facility] Cardiologist:  Leslieanne Cobarrubias  [facility] Electrophysiologist:  None   Referring MD: Celene Squibb, MD   Chief Complaint  Patient presents with  . Abnormal ECG  2  Jan. 28, 2022   Philip Ross is a 65 y.o. male with a hx of  Abnormal ECG , hyperlipidemia.  We were asked to see him for further evaluation of an abnormal ECG by Dr. Nevada Crane.  He was seen at [facility] with some chest pain .  I saw him 10 years ago  Chest pain for the past 2 weeks  ECG showed left axis deviation. We were asked to see  Him for further evaluation .  Has had heaviness in his chest for the past 3-4 weeks. Was constant , now is intermittant Like a muscle cramp.  Not exertional Occurs when he is standing   Was sent to the ER ,  Work up was negative ,  troponins are normal . Had some chest heaviness yesterday  Might last a few minutes.  Labs from his medical doctor were reviewed.  Total cholesterol is 174 HDL is 42 LDL is 105 Triglyceride level of 86.       Past Medical History:  Diagnosis Date  . Allergy   . Arthritis   . Bone fracture   . Chest pain   . Hyperlipidemia   . Palpitations     Past Surgical History:  Procedure Laterality Date  . KNEE SURGERY    . TONSILLECTOMY    . varicose vein removal      Current Medications: Current Meds  Medication Sig  . Ascorbic Acid (VITAMIN C) 1000 MG tablet Take 1,000 mg by mouth daily.  Marland Kitchen aspirin EC 81 MG tablet Take 81 mg by mouth daily. Swallow whole.  . metoprolol tartrate (LOPRESSOR) 100 MG tablet Take 1 pill (100 mg) 2 hours before your Coronary CT  . Multiple Vitamins-Minerals (ICAPS PO) Take 1 tablet by mouth daily.  . Turmeric (QC TUMERIC COMPLEX) 500 MG CAPS Take 1 capsule by mouth daily at 12 noon.  . Zinc 50 MG TABS Take 1 tablet by mouth daily in the afternoon.     Allergies:   Niaspan  [niacin er]   Social History   Socioeconomic History  . Marital status: Married    Spouse name: Not on file  . Number of children: Not on file  . Years of education: Not on file  . Highest education level: Not on file  Occupational History  . Not on file  Tobacco Use  . Smoking status: Never Smoker  . Smokeless tobacco: Never Used  Substance and Sexual Activity  . Alcohol use: Yes    Alcohol/week: 1.0 - 2.0 standard drink    Types: 1 - 2 Cans of beer per week    Comment: per week  . Drug use: Not on file  . Sexual activity: Not on file  Other Topics Concern  . Not on file  Social History Narrative  . Not on file   Social Determinants of Health   Financial Resource Strain: Not on file  Food Insecurity: Not on file  Transportation Needs: Not on file  Physical Activity: Not on file  Stress: Not on file  Social Connections: Not on file     Family History: The patient's family history  includes Heart attack in his father. There is no history of Colon cancer, Esophageal cancer, Pancreatic cancer, Rectal cancer, or Stomach cancer.  ROS:   Please see the history of present illness.     All other systems reviewed and are negative.  EKGs/Labs/Other Studies Reviewed:    The following studies were reviewed today:   EKG:   Dec. 29, 2021:  NSR , LAD, no St or T wave changes.   Recent Labs: 10/06/2020: BUN 20; Creatinine, Ser 1.05; Hemoglobin 14.8; Platelets 204; Potassium 4.2; Sodium 138  Recent Lipid Panel No results found for: CHOL, TRIG, HDL, CHOLHDL, VLDL, LDLCALC, LDLDIRECT   Risk Assessment/Calculations:       Physical Exam:    VS:  BP 102/62   Pulse 63   Ht 6' 1"  (1.854 m)   Wt 234 lb 9.6 oz (106.4 kg)   SpO2 98%   BMI 30.95 kg/m     Wt Readings from Last 3 Encounters:  11/05/20 234 lb 9.6 oz (106.4 kg)  10/06/20 240 lb (108.9 kg)  02/25/18 240 lb (108.9 kg)     GEN:  Well nourished, well developed in no acute distress HEENT: Normal NECK: No JVD;  No carotid bruits LYMPHATICS: No lymphadenopathy CARDIAC: RRR, no murmurs, rubs, gallops RESPIRATORY:  Clear to auscultation without rales, wheezing or rhonchi  ABDOMEN: Soft, non-tender, non-distended MUSCULOSKELETAL:  No edema; No deformity  SKIN: Warm and dry NEUROLOGIC:  Alert and oriented x 3 PSYCHIATRIC:  Normal affect   ASSESSMENT:    1. Chest pain, unspecified type   2. Other chest pain    PLAN:       1. Chest heaviness: [name] presents with some episodes of chest heaviness.  Symptoms are somewhat atypical but are worrisome.  I would like to get a coronary CT angiogram for further evaluation.  This will also give [place] a coronary calcium score will give [place] a further risk assessment as to whether or not he has coronary artery.  If he does have significant coronary calcium, we will start him on a statin.   see him again in 3 months for follow-up visit.        Medication Adjustments/Labs and Tests Ordered: Current medicines are reviewed at length with the patient today.  Concerns regarding medicines are outlined above.  Orders Placed This Encounter  Procedures  . CT CORONARY MORPH W/CTA COR W/SCORE W/CA W/CM &/OR WO/CM  . CT CORONARY FRACTIONAL FLOW RESERVE DATA PREP  . CT CORONARY FRACTIONAL FLOW RESERVE FLUID ANALYSIS  . Basic Metabolic Panel (BMET)   Meds ordered this encounter  Medications  . metoprolol tartrate (LOPRESSOR) 100 MG tablet    Sig: Take 1 pill (100 mg) 2 hours before your Coronary CT    Dispense:  1 tablet    Refill:  0    Patient Instructions  Medication Instructions:  Your physician recommends that you continue on your current medications as directed. Please refer to the Current Medication list given to you today.  *If you need a refill on your cardiac medications before your next appointment, please call your pharmacy*   Lab Work: Your physician recommends that you return for lab work in: 1 week to 2 days before your Coronary CT  If you  have labs (blood work) drawn today and your tests are completely normal, you will receive your results only by: Marland Kitchen MyChart Message (if you have MyChart) OR . A paper copy in the mail If you have any lab test that  is abnormal or we need to change your treatment, we will call you to review the results.   Testing/Procedures: Cardiac CT scanning, (CAT scanning), is a noninvasive, special x-ray that produces cross-sectional images of the body using x-rays and a computer. CT scans help physicians diagnose and treat medical conditions. For some CT exams, a contrast material is used to enhance visibility in the area of the body being studied. CT scans provide greater clarity and reveal more details than regular x-ray exams. **See instructions below  Follow-Up: At [facility], you and your health needs are our priority.  As part of our continuing mission to provide you with exceptional heart care, we have created designated Provider Care Teams.  These Care Teams include your primary Cardiologist (physician) and Advanced Practice Providers (APPs -  Physician Assistants and Nurse Practitioners) who all work together to provide you with the care you need, when you need it.  We recommend signing up for the patient portal called "MyChart".  Sign up information is provided on this After Visit Summary.  MyChart is used to connect with patients for Virtual Visits (Telemedicine).  Patients are able to view lab/test results, encounter notes, upcoming appointments, etc.  Non-urgent messages can be sent to your provider as well.   To learn more about what you can do with MyChart, go to [email or web address].    Your next appointment:   3 month(s) on April 29 at 8 am  The format for your next appointment:   In Person  Provider:   Mertie Moores, MD   Other Instructions Your cardiac CT will be scheduled at one of the below locations:   [facility] [address] [place], [place]  [address] [phone]  [facility] [address] [place],  [address] [phone]  If scheduled at [facility], please arrive at the [facility] main entrance of [facility] 30 minutes prior to test start time. Proceed to the [facility] Radiology Department (first floor) to check-in and test prep.  If scheduled at [facility], please arrive 15 mins early for check-in and test prep.  Please follow these instructions carefully (unless otherwise directed):  Hold all erectile dysfunction medications at least 3 days (72 hrs) prior to test.  On the Night Before the Test: . Be sure to Drink plenty of water. . Do not consume any caffeinated/decaffeinated beverages or chocolate 12 hours prior to your test. . Do not take any antihistamines 12 hours prior to your test. . If the patient has contrast allergy: ? Patient will need a prescription for Prednisone and very clear instructions (as follows): 1. Prednisone 50 mg - take 13 hours prior to test 2. Take another Prednisone 50 mg 7 hours prior to test 3. Take another Prednisone 50 mg 1 hour prior to test 4. Take Benadryl 50 mg 1 hour prior to test . Patient must complete all four doses of above prophylactic medications. . Patient will need a ride after test due to Benadryl.  On the Day of the Test: . Drink plenty of water. Do not drink any water within one hour of the test. . Do not eat any food 4 hours prior to the test. . You may take your regular medications prior to the test.  . Take metoprolol (Lopressor) two hours prior to test.        After the Test: . Drink plenty of water. . After receiving IV contrast,  you may experience a mild flushed feeling. This is normal. . On occasion, you may experience a mild rash up to 24 hours after the test. This is not dangerous. If this occurs, you can take Benadryl 25 mg and increase your fluid  intake. . If you experience trouble breathing, this can be serious. If it is severe call 911 IMMEDIATELY. If it is mild, please call our office. . If you take any of these medications: Glipizide/Metformin, Avandament, Glucavance, please do not take 48 hours after completing test unless otherwise instructed.   Once we have confirmed authorization from your insurance company, we will call you to set up a date and time for your test. Based on how quickly your insurance processes prior authorizations requests, please allow up to 4 weeks to be contacted for scheduling your Cardiac CT appointment. Be advised that routine Cardiac CT appointments could be scheduled as many as 8 weeks after your provider has ordered it.  For non-scheduling related questions, please contact the cardiac imaging nurse navigator should you have any questions/concerns: Marchia Bond, Cardiac Imaging Nurse Navigator Burley Saver, Interim Cardiac Imaging Nurse [place] and Vascular Services Direct Office Dial: [phone]   For scheduling needs, including cancellations and rescheduling, please call [place], [phone].       Signed, Mertie Moores, MD  11/05/2020 9:14 AM    [place]

## 2020-11-05 ENCOUNTER — Other Ambulatory Visit: Payer: Self-pay

## 2020-11-05 ENCOUNTER — Ambulatory Visit: Payer: BC Managed Care – PPO | Admitting: Cardiovascular Disease

## 2020-11-05 ENCOUNTER — Encounter: Payer: Self-pay | Admitting: Cardiovascular Disease

## 2020-11-05 DIAGNOSIS — R079 Chest pain, unspecified: Secondary | ICD-10-CM | POA: Diagnosis not present

## 2020-11-05 DIAGNOSIS — R0789 Other chest pain: Secondary | ICD-10-CM

## 2020-11-05 MED ORDER — METOPROLOL TARTRATE 100 MG PO TABS: ORAL_TABLET | ORAL | 0 refills | Status: DC

## 2020-11-05 NOTE — Patient Instructions (Addendum)
Medication Instructions:  Your physician recommends that you continue on your current medications as directed. Please refer to the Current Medication list given to you today.  *If you need a refill on your cardiac medications before your next appointment, please call your pharmacy*   Lab Work: Your physician recommends that you return for lab work in: 1 week to 2 days before your Coronary CT  If you have labs (blood work) drawn today and your tests are completely normal, you will receive your results only by: Marland Kitchen MyChart Message (if you have MyChart) OR . A paper copy in the mail If you have any lab test that is abnormal or we need to change your treatment, we will call you to review the results.   Testing/Procedures: Cardiac CT scanning, (CAT scanning), is a noninvasive, special x-ray that produces cross-sectional images of the body using x-rays and a computer. CT scans help physicians diagnose and treat medical conditions. For some CT exams, a contrast material is used to enhance visibility in the area of the body being studied. CT scans provide greater clarity and reveal more details than regular x-ray exams. **See instructions below  Follow-Up: At [facility], you and your health needs are our priority.  As part of our continuing mission to provide you with exceptional heart care, we have created designated Provider Care Teams.  These Care Teams include your primary Cardiologist (physician) and Advanced Practice Providers (APPs -  Physician Assistants and Nurse Practitioners) who all work together to provide you with the care you need, when you need it.  We recommend signing up for the patient portal called "MyChart".  Sign up information is provided on this After Visit Summary.  MyChart is used to connect with patients for Virtual Visits (Telemedicine).  Patients are able to view lab/test results, encounter notes, upcoming appointments, etc.  Non-urgent messages can be sent to your  provider as well.   To learn more about what you can do with MyChart, go to [email or web address].    Your next appointment:   3 month(s) on April 29 at 8 am  The format for your next appointment:   In Person  Provider:   Mertie Moores, MD   Other Instructions Your cardiac CT will be scheduled at one of the below locations:   [facility] [address] [place], [place] [address] [phone]  [facility] [address] [place], [place] [address] [phone]  If scheduled at [facility], please arrive at the [facility] main entrance of [facility] 30 minutes prior to test start time. Proceed to the [facility] Radiology Department (first floor) to check-in and test prep.  If scheduled at [facility], please arrive 15 mins early for check-in and test prep.  Please follow these instructions carefully (unless otherwise directed):  Hold all erectile dysfunction medications at least 3 days (72 hrs) prior to test.  On the Night Before the Test: . Be sure to Drink plenty of water. . Do not consume any caffeinated/decaffeinated beverages or chocolate 12 hours prior to your test. . Do not take any antihistamines 12 hours prior to your test. . If the patient has contrast allergy: ? Patient will need a prescription for Prednisone and very clear instructions (as follows): 1. Prednisone 50 mg - take 13 hours prior to test 2. Take another Prednisone 50 mg 7 hours prior to test 3. Take another Prednisone 50 mg 1 hour  prior to test 4. Take Benadryl 50 mg 1 hour prior to test . Patient must complete all four doses of above prophylactic medications. . Patient will need a ride after test due to Benadryl.  On the Day of the Test: . Drink plenty of water. Do not drink any water within one hour of the test. . Do not eat any food 4 hours prior to the test. . You may take your  regular medications prior to the test.  . Take metoprolol (Lopressor) two hours prior to test.        After the Test: . Drink plenty of water. . After receiving IV contrast, you may experience a mild flushed feeling. This is normal. . On occasion, you may experience a mild rash up to 24 hours after the test. This is not dangerous. If this occurs, you can take Benadryl 25 mg and increase your fluid intake. . If you experience trouble breathing, this can be serious. If it is severe call 911 IMMEDIATELY. If it is mild, please call our office. . If you take any of these medications: Glipizide/Metformin, Avandament, Glucavance, please do not take 48 hours after completing test unless otherwise instructed.   Once we have confirmed authorization from your insurance company, we will call you to set up a date and time for your test. Based on how quickly your insurance processes prior authorizations requests, please allow up to 4 weeks to be contacted for scheduling your Cardiac CT appointment. Be advised that routine Cardiac CT appointments could be scheduled as many as 8 weeks after your provider has ordered it.  For non-scheduling related questions, please contact the cardiac imaging nurse navigator should you have any questions/concerns: Marchia Bond, Cardiac Imaging Nurse Navigator Burley Saver, Interim Cardiac Imaging Nurse [place] and Vascular Services Direct Office Dial: [phone]   For scheduling needs, including cancellations and rescheduling, please call [place], [phone].

## 2020-11-16 ENCOUNTER — Other Ambulatory Visit: Payer: BC Managed Care – PPO

## 2020-11-16 ENCOUNTER — Other Ambulatory Visit: Payer: Self-pay

## 2020-11-16 DIAGNOSIS — R079 Chest pain, unspecified: Secondary | ICD-10-CM

## 2020-11-16 LAB — BASIC METABOLIC PANEL
BUN/Creatinine Ratio: 18 (ref 10–24)
BUN: 17 mg/dL (ref 8–27)
CO2: 28 mmol/L (ref 20–29)
Calcium: 9.3 mg/dL (ref 8.6–10.2)
Chloride: 101 mmol/L (ref 96–106)
Creatinine, Ser: 0.95 mg/dL (ref 0.76–1.27)
GFR calc Af Amer: 98 mL/min/{1.73_m2} (ref >59)
GFR calc non Af Amer: 85 mL/min/{1.73_m2} (ref >59)
Glucose: 110 mg/dL — ABNORMAL HIGH (ref 65–99)
Potassium: 4 mmol/L (ref 3.5–5.2)
Sodium: 137 mmol/L (ref 134–144)

## 2020-11-17 ENCOUNTER — Telehealth (HOSPITAL_COMMUNITY): Payer: Self-pay | Admitting: *Deleted

## 2020-11-17 NOTE — Telephone Encounter (Signed)
Reaching out to patient to offer assistance regarding upcoming cardiac imaging study; pt verbalizes understanding of appt date/time, parking situation and where to check in, pre-test NPO status and medications ordered, and verified current allergies; name and call back number provided for further questions should they arise  Edrees Valent RN Navigator Cardiac Imaging [place] [facility] [phone] office [phone] cell  

## 2020-11-19 ENCOUNTER — Ambulatory Visit (HOSPITAL_COMMUNITY)
Admission: RE | Admit: 2020-11-19 | Discharge: 2020-11-19 | Disposition: A | Discharge status: Home / Self Care | Payer: BC Managed Care – PPO | Source: Ambulatory Visit | Attending: Cardiovascular Disease | Admitting: Cardiovascular Disease

## 2020-11-19 ENCOUNTER — Other Ambulatory Visit: Payer: Self-pay

## 2020-11-19 DIAGNOSIS — R0789 Other chest pain: Secondary | ICD-10-CM | POA: Insufficient documentation

## 2020-11-19 MED ORDER — NITROGLYCERIN 0.4 MG SL SUBL
SUBLINGUAL_TABLET | SUBLINGUAL | Status: AC
  Filled 2020-11-19: qty 2

## 2020-11-19 MED ORDER — IOHEXOL 350 MG/ML SOLN
80.0000 mL | Freq: Once | INTRAVENOUS | Status: AC | PRN
  Administered 2020-11-19: 80 mL via INTRAVENOUS

## 2020-11-19 MED ORDER — NITROGLYCERIN 0.4 MG SL SUBL
0.8000 mg | SUBLINGUAL_TABLET | Freq: Once | SUBLINGUAL | Status: AC
  Administered 2020-11-19: 0.8 mg via SUBLINGUAL

## 2021-02-04 ENCOUNTER — Ambulatory Visit: Payer: BC Managed Care – PPO | Admitting: Cardiovascular Disease

## 2022-04-13 DIAGNOSIS — C44329 Squamous cell carcinoma of skin of other parts of face: Secondary | ICD-10-CM | POA: Diagnosis not present

## 2022-04-20 DIAGNOSIS — Z0000 Encounter for general adult medical examination without abnormal findings: Secondary | ICD-10-CM | POA: Diagnosis not present

## 2022-06-20 ENCOUNTER — Encounter: Payer: Self-pay | Admitting: Gastroenterology

## 2022-07-10 DIAGNOSIS — Z23 Encounter for immunization: Secondary | ICD-10-CM | POA: Diagnosis not present

## 2022-07-10 DIAGNOSIS — Z0000 Encounter for general adult medical examination without abnormal findings: Secondary | ICD-10-CM | POA: Diagnosis not present

## 2022-07-10 DIAGNOSIS — Z6829 Body mass index (BMI) 29.0-29.9, adult: Secondary | ICD-10-CM | POA: Diagnosis not present

## 2022-07-10 DIAGNOSIS — H9313 Tinnitus, bilateral: Secondary | ICD-10-CM | POA: Diagnosis not present

## 2022-07-10 DIAGNOSIS — K449 Diaphragmatic hernia without obstruction or gangrene: Secondary | ICD-10-CM | POA: Diagnosis not present

## 2022-07-25 DIAGNOSIS — C44719 Basal cell carcinoma of skin of left lower limb, including hip: Secondary | ICD-10-CM | POA: Diagnosis not present

## 2022-07-25 DIAGNOSIS — D225 Melanocytic nevi of trunk: Secondary | ICD-10-CM | POA: Diagnosis not present

## 2022-07-25 DIAGNOSIS — L578 Other skin changes due to chronic exposure to nonionizing radiation: Secondary | ICD-10-CM | POA: Diagnosis not present

## 2022-07-25 DIAGNOSIS — L851 Acquired keratosis [keratoderma] palmaris et plantaris: Secondary | ICD-10-CM | POA: Diagnosis not present

## 2022-07-25 DIAGNOSIS — D485 Neoplasm of uncertain behavior of skin: Secondary | ICD-10-CM | POA: Diagnosis not present

## 2022-07-25 DIAGNOSIS — Z85828 Personal history of other malignant neoplasm of skin: Secondary | ICD-10-CM | POA: Diagnosis not present

## 2022-07-25 DIAGNOSIS — D2271 Melanocytic nevi of right lower limb, including hip: Secondary | ICD-10-CM | POA: Diagnosis not present

## 2022-07-25 DIAGNOSIS — L821 Other seborrheic keratosis: Secondary | ICD-10-CM | POA: Diagnosis not present

## 2022-07-25 DIAGNOSIS — C4441 Basal cell carcinoma of skin of scalp and neck: Secondary | ICD-10-CM | POA: Diagnosis not present

## 2022-07-25 DIAGNOSIS — L570 Actinic keratosis: Secondary | ICD-10-CM | POA: Diagnosis not present

## 2022-07-25 DIAGNOSIS — L814 Other melanin hyperpigmentation: Secondary | ICD-10-CM | POA: Diagnosis not present

## 2022-08-08 DIAGNOSIS — C44219 Basal cell carcinoma of skin of left ear and external auricular canal: Secondary | ICD-10-CM | POA: Diagnosis not present

## 2022-08-21 ENCOUNTER — Ambulatory Visit (INDEPENDENT_AMBULATORY_CARE_PROVIDER_SITE_OTHER): Payer: PPO | Admitting: Gastroenterology

## 2022-08-21 ENCOUNTER — Encounter: Payer: Self-pay | Admitting: Gastroenterology

## 2022-08-21 VITALS — BP 128/70 | HR 77 | Ht 73.0 in | Wt 228.0 lb

## 2022-08-21 DIAGNOSIS — R109 Unspecified abdominal pain: Secondary | ICD-10-CM

## 2022-08-21 DIAGNOSIS — Z86010 Personal history of colonic polyps: Secondary | ICD-10-CM | POA: Diagnosis not present

## 2022-08-21 DIAGNOSIS — K449 Diaphragmatic hernia without obstruction or gangrene: Secondary | ICD-10-CM | POA: Diagnosis not present

## 2022-08-21 DIAGNOSIS — K760 Fatty (change of) liver, not elsewhere classified: Secondary | ICD-10-CM

## 2022-08-21 NOTE — Progress Notes (Signed)
HPI :  66 year old male with a history of colon polyps, hiatal hernia, atypical chest pain, here to reestablish care for recent episodes of flank pain.  I last saw him in January 2019.  The main reason he booked a office visit today was for abdominal pain.  He actually reports his pain was in the left low back and left flank, not abdomen.  Started couple months ago.  He had 3-4 episodes of severe left flank to left lower back pain within about a weeks time or so.  Pain was not radiating to other parts of his body.  Denied sciatica.  He denied any abdominal pain.  It would last for hours at a time.  He got nauseated with this and had a few episodes of vomiting.  No fevers.  No diarrhea.  Denies any blood in his stools.  No changes in his bowels.  Denies any blood in his urine.  No dysuria.  He states this went on for about a week and then it stopped.  For the past 6 weeks he has not had any pains like this, no recurrence.  He denies any history of kidney stones.  No trauma to the area.  He has never had anything like this before.  Generally eating okay.  No weight loss.  He did have some atypical chest pain back in February 2022 which led to a cardiac CT.  He was not noted to have any coronary artery disease but he did have hiatal hernia noted, small to moderate size, was difficult for them to see any clear esophageal thickening, small reactive lymph nodes suspected in the area as well.  He denies any reflux that bothers him at baseline.  In retrospect the chest discomfort he thinks was probably related to his esophagus at the time.  Denies any dysphagia.  Has not had any recurrent episodes since that time.    He is status post cholecystectomy a few years ago.  He was noted to have gallstones on ultrasound in 2019, he was also noted to have fatty liver on that exam.  He is unaware of that.  His weight has been stable in general, although he did gained 10 pounds over the past several months.  He denies any  routine alcohol use.  He states he had some blood work done recently with his primary care which was normal but I do not see any of that on file today.  Prior work-up: Colonoscopy 10/17/2017 - The perianal and digital rectal examinations were normal. - Two sessile polyps were found in the cecum. The polyps were 4 to 7 mm in size. These polyps were removed with a cold snare. Resection and retrieval were complete. - Internal hemorrhoids were found during retroflexion. The hemorrhoids were small. - The colon was tortous. The exam was otherwise without abnormality.  Surgical [P], cecum, polyp - TUBULAR ADENOMA (THREE FRAGMENTS). - NO HIGH GRADE DYSPLASIA OR MALIGNANCY.  Due for repeat colonoscopy 10/2024  Past Medical History:  Diagnosis Date   Allergy    Arthritis    Bone fracture    Chest pain    Hyperlipidemia    Osteoarthritis of left knee    Palpitations    Skin cancer of face      Past Surgical History:  Procedure Laterality Date   CHOLECYSTECTOMY     KNEE SURGERY     TONSILLECTOMY     varicose vein removal     Family History  Problem Relation Age of  Onset   Heart disease Father    Heart attack Father    Colon cancer Neg Hx    Esophageal cancer Neg Hx    Pancreatic cancer Neg Hx    Rectal cancer Neg Hx    Stomach cancer Neg Hx    Social History   Tobacco Use   Smoking status: Never   Smokeless tobacco: Never  Vaping Use   Vaping Use: Never used  Substance Use Topics   Alcohol use: Yes    Alcohol/week: 1.0 - 2.0 standard drink of alcohol    Types: 1 - 2 Cans of beer per week    Comment: per week   Current Outpatient Medications  Medication Sig Dispense Refill   Ascorbic Acid (VITAMIN C) 1000 MG tablet Take 1,000 mg by mouth daily.     Multiple Vitamins-Minerals (ICAPS PO) Take 1 tablet by mouth daily.     No current facility-administered medications for this visit.   Allergies  Allergen Reactions   Niaspan [Niacin Er]     flushing     Review of  Systems: All systems reviewed and negative except where noted in HPI.   No labs listed in Epic recently  Physical Exam: BP 128/70   Pulse 77   Ht '6\' 1"'$  (1.854 m)   Wt 228 lb (103.4 kg)   SpO2 95%   BMI 30.08 kg/m  Constitutional: Pleasant,well-developed, male in no acute distress. HEENT: Normocephalic and atraumatic. Conjunctivae are normal. No scleral icterus. Neck supple.  Cardiovascular: Normal rate, regular rhythm.  Pulmonary/chest: Effort normal and breath sounds normal. No wheezing, rales or rhonchi. Abdominal: Soft, nondistended, nontender. There are no masses palpable. No hepatomegaly. No pain with palpation of left flank or back. Extremities: no edema Lymphadenopathy: No cervical adenopathy noted. Neurological: Alert and oriented to person place and time. Skin: Skin is warm and dry. No rashes noted. Psychiatric: Normal mood and affect. Behavior is normal.   ASSESSMENT: 66 y.o. male here for assessment of the following  1. Left flank pain   2. Fatty liver   3. Hiatal hernia   4. History of colon polyps    History and exam as above.  1 week worth of episodic left flank to left lower back pain without radiation but associated with nausea and vomiting.  He denies any dysuria.  Discussed differential diagnosis which could include kidney stones, or perhaps musculoskeletal pain.  He denies any problems with his bowels, does not sound like he was constipated.  Told him that pancreatitis or PUD etc. would both be unusual for that pain location, I am not convinced that is coming from his GI tract.  Fortunately has been feeling well for the past 6 weeks without any recurrent pain at all.  If he does have recurrent symptoms asked him to contact us, in this situation would pursue a CT scan to further evaluate.  He agrees.  Given the symptoms I asked for baseline lab work that was done by his primary care and will review that to make sure okay.  I reviewed his prior ultrasound and  discussed fatty liver disease with him in general.  We will await his LFTs from his yearly physical to make sure okay.  He was counseled on fatty liver, risk for cirrhosis, recommend weight loss and follow-up yearly for this issue.  He does not drink alcohol routinely.  Cardiac CT shows suspected small to moderate hiatal hernia, suspect reflux could have been the cause of his prior chest symptoms at  the time.  He denies any baseline reflux symptoms.  If he has any recurrent chest discomfort he can try Pepcid as needed.  Do not feel strongly he needs an EGD right now given no bothersome symptoms.  He agrees.  Due for surveillance colonoscopy 7 years from his last exam based on updated surveillance guidelines, January 2026.   PLAN: - call us if recurrent pain, will do a CT scan to further evaluate - get records release to obtain labs from recent yearly physical - counseled on fatty liver, risks, etc. Weight loss recommended, yearly LFTs - discussed CT findings, likely he had reflux previously when seeing cardiology. With dietary measures this has resolved. Discussed CT findings, offered EGD, don't feel strongly he needs this, he declines at this time - pepcid PRN for chest symptoms / reflux - change recall colonoscopy to 10/2024  Jolly Mango, MD [facility]  CC: Celene Squibb, MD

## 2022-08-21 NOTE — Patient Instructions (Addendum)
If you are age 65 or older, your body mass index should be between 23-30. Your Body mass index is 30.08 kg/m. If this is out of the aforementioned range listed, please consider follow up with your Primary Care Provider.  If you are age 83 or younger, your body mass index should be between 19-25. Your Body mass index is 30.08 kg/m. If this is out of the aformentioned range listed, please consider follow up with your Primary Care Provider.   ________________________________________________________  Please call us if you have recurrent pain.  We will request your recent labs from Dr. [place] Crane.  You can use Pepcid as needed for chest and reflux symptoms.  You will be due for a recall colonoscopy in 10-2024. We will send you a reminder in the mail when it gets closer to that time.   Thank you for entrusting me with your care and for choosing [facility], Dr. [place] Cellar

## 2022-09-25 DIAGNOSIS — C44719 Basal cell carcinoma of skin of left lower limb, including hip: Secondary | ICD-10-CM | POA: Diagnosis not present

## 2022-09-25 DIAGNOSIS — L905 Scar conditions and fibrosis of skin: Secondary | ICD-10-CM | POA: Diagnosis not present

## 2022-10-09 DIAGNOSIS — R69 Illness, unspecified: Secondary | ICD-10-CM | POA: Diagnosis not present

## 2022-10-14 DIAGNOSIS — R69 Illness, unspecified: Secondary | ICD-10-CM | POA: Diagnosis not present

## 2022-11-08 ENCOUNTER — Telehealth: Payer: Self-pay | Admitting: Gastroenterology

## 2022-11-08 NOTE — Telephone Encounter (Signed)
Labs arrived from prior request from November.  04/20/22  AP 59, ALT 11, AST 17, T bil 1.5  Hgb 14.1, MCV 93, WBC 5.2, plt 249  He should follow up with yearly labs with his PCP for fatty liver as previously discussed

## 2022-11-09 DIAGNOSIS — R69 Illness, unspecified: Secondary | ICD-10-CM | POA: Diagnosis not present

## 2022-12-08 DIAGNOSIS — R69 Illness, unspecified: Secondary | ICD-10-CM | POA: Diagnosis not present

## 2023-01-23 DIAGNOSIS — L820 Inflamed seborrheic keratosis: Secondary | ICD-10-CM | POA: Diagnosis not present

## 2023-03-10 DIAGNOSIS — R69 Illness, unspecified: Secondary | ICD-10-CM | POA: Diagnosis not present

## 2023-04-09 DIAGNOSIS — R69 Illness, unspecified: Secondary | ICD-10-CM | POA: Diagnosis not present

## 2023-07-17 DIAGNOSIS — H9313 Tinnitus, bilateral: Secondary | ICD-10-CM | POA: Diagnosis not present

## 2023-07-17 DIAGNOSIS — K449 Diaphragmatic hernia without obstruction or gangrene: Secondary | ICD-10-CM | POA: Diagnosis not present

## 2023-07-17 DIAGNOSIS — E663 Overweight: Secondary | ICD-10-CM | POA: Diagnosis not present

## 2023-07-17 DIAGNOSIS — Z23 Encounter for immunization: Secondary | ICD-10-CM | POA: Diagnosis not present

## 2023-07-17 DIAGNOSIS — Z6829 Body mass index (BMI) 29.0-29.9, adult: Secondary | ICD-10-CM | POA: Diagnosis not present

## 2023-07-17 DIAGNOSIS — R7989 Other specified abnormal findings of blood chemistry: Secondary | ICD-10-CM | POA: Diagnosis not present

## 2023-07-17 DIAGNOSIS — Z0001 Encounter for general adult medical examination with abnormal findings: Secondary | ICD-10-CM | POA: Diagnosis not present

## 2023-08-15 DIAGNOSIS — L821 Other seborrheic keratosis: Secondary | ICD-10-CM | POA: Diagnosis not present

## 2023-08-15 DIAGNOSIS — L814 Other melanin hyperpigmentation: Secondary | ICD-10-CM | POA: Diagnosis not present

## 2023-08-15 DIAGNOSIS — Z85828 Personal history of other malignant neoplasm of skin: Secondary | ICD-10-CM | POA: Diagnosis not present

## 2023-08-15 DIAGNOSIS — L578 Other skin changes due to chronic exposure to nonionizing radiation: Secondary | ICD-10-CM | POA: Diagnosis not present

## 2023-08-15 DIAGNOSIS — L570 Actinic keratosis: Secondary | ICD-10-CM | POA: Diagnosis not present

## 2023-08-15 DIAGNOSIS — D225 Melanocytic nevi of trunk: Secondary | ICD-10-CM | POA: Diagnosis not present

## 2023-08-15 DIAGNOSIS — D2271 Melanocytic nevi of right lower limb, including hip: Secondary | ICD-10-CM | POA: Diagnosis not present

## 2023-09-26 DIAGNOSIS — H5203 Hypermetropia, bilateral: Secondary | ICD-10-CM | POA: Diagnosis not present

## 2023-10-16 DIAGNOSIS — C4441 Basal cell carcinoma of skin of scalp and neck: Secondary | ICD-10-CM | POA: Diagnosis not present

## 2023-10-16 DIAGNOSIS — D485 Neoplasm of uncertain behavior of skin: Secondary | ICD-10-CM | POA: Diagnosis not present

## 2023-11-21 DIAGNOSIS — C4441 Basal cell carcinoma of skin of scalp and neck: Secondary | ICD-10-CM | POA: Diagnosis not present

## 2024-04-28 DIAGNOSIS — K080 Exfoliation of teeth due to systemic causes: Secondary | ICD-10-CM | POA: Diagnosis not present

## 2024-05-30 DIAGNOSIS — K080 Exfoliation of teeth due to systemic causes: Secondary | ICD-10-CM | POA: Diagnosis not present

## 2024-07-07 DIAGNOSIS — E669 Obesity, unspecified: Secondary | ICD-10-CM | POA: Diagnosis not present

## 2024-07-11 DIAGNOSIS — Z131 Encounter for screening for diabetes mellitus: Secondary | ICD-10-CM | POA: Diagnosis not present

## 2024-07-11 DIAGNOSIS — Z0000 Encounter for general adult medical examination without abnormal findings: Secondary | ICD-10-CM | POA: Diagnosis not present

## 2024-07-24 DIAGNOSIS — Z6829 Body mass index (BMI) 29.0-29.9, adult: Secondary | ICD-10-CM | POA: Diagnosis not present

## 2024-07-24 DIAGNOSIS — Z23 Encounter for immunization: Secondary | ICD-10-CM | POA: Diagnosis not present

## 2024-07-24 DIAGNOSIS — K449 Diaphragmatic hernia without obstruction or gangrene: Secondary | ICD-10-CM | POA: Diagnosis not present

## 2024-07-24 DIAGNOSIS — Z0001 Encounter for general adult medical examination with abnormal findings: Secondary | ICD-10-CM | POA: Diagnosis not present

## 2024-07-24 DIAGNOSIS — R7989 Other specified abnormal findings of blood chemistry: Secondary | ICD-10-CM | POA: Diagnosis not present

## 2024-07-24 DIAGNOSIS — H9313 Tinnitus, bilateral: Secondary | ICD-10-CM | POA: Diagnosis not present

## 2024-09-10 DIAGNOSIS — D2271 Melanocytic nevi of right lower limb, including hip: Secondary | ICD-10-CM | POA: Diagnosis not present

## 2024-09-10 DIAGNOSIS — L570 Actinic keratosis: Secondary | ICD-10-CM | POA: Diagnosis not present

## 2024-09-10 DIAGNOSIS — L814 Other melanin hyperpigmentation: Secondary | ICD-10-CM | POA: Diagnosis not present

## 2024-09-10 DIAGNOSIS — D225 Melanocytic nevi of trunk: Secondary | ICD-10-CM | POA: Diagnosis not present

## 2024-09-10 DIAGNOSIS — L821 Other seborrheic keratosis: Secondary | ICD-10-CM | POA: Diagnosis not present

## 2024-09-10 DIAGNOSIS — C44719 Basal cell carcinoma of skin of left lower limb, including hip: Secondary | ICD-10-CM | POA: Diagnosis not present

## 2024-09-10 DIAGNOSIS — D485 Neoplasm of uncertain behavior of skin: Secondary | ICD-10-CM | POA: Diagnosis not present

## 2024-09-29 DIAGNOSIS — H5203 Hypermetropia, bilateral: Secondary | ICD-10-CM | POA: Diagnosis not present

## 2024-10-18 ENCOUNTER — Encounter: Payer: Self-pay | Admitting: Gastroenterology

## 2024-11-19 ENCOUNTER — Encounter

## 2024-12-01 ENCOUNTER — Encounter: Admitting: Gastroenterology
